# Patient Record
Sex: Female | Born: 1935 | Race: White | Hispanic: No | Smoking: Never smoker
Health system: Southern US, Community
[De-identification: ages and names within clinical notes are randomized; demographics above are authoritative.]

---

## 2016-01-17 ENCOUNTER — Encounter (HOSPITAL_COMMUNITY): Payer: Self-pay | Admitting: Emergency Medicine

## 2016-01-17 ENCOUNTER — Emergency Department (HOSPITAL_COMMUNITY): Payer: Medicare Other

## 2016-01-17 ENCOUNTER — Observation Stay (HOSPITAL_COMMUNITY)
Admission: EM | Admit: 2016-01-17 | Discharge: 2016-01-18 | Disposition: A | Discharge status: Home / Self Care | Payer: Medicare Other | Attending: Family Medicine | Admitting: Family Medicine

## 2016-01-17 DIAGNOSIS — E876 Hypokalemia: Secondary | ICD-10-CM | POA: Insufficient documentation

## 2016-01-17 DIAGNOSIS — Z87891 Personal history of nicotine dependence: Secondary | ICD-10-CM | POA: Insufficient documentation

## 2016-01-17 DIAGNOSIS — H5509 Other forms of nystagmus: Secondary | ICD-10-CM | POA: Diagnosis not present

## 2016-01-17 DIAGNOSIS — R42 Dizziness and giddiness: Secondary | ICD-10-CM

## 2016-01-17 DIAGNOSIS — E860 Dehydration: Secondary | ICD-10-CM | POA: Diagnosis not present

## 2016-01-17 DIAGNOSIS — R270 Ataxia, unspecified: Secondary | ICD-10-CM | POA: Insufficient documentation

## 2016-01-17 DIAGNOSIS — R030 Elevated blood-pressure reading, without diagnosis of hypertension: Secondary | ICD-10-CM | POA: Diagnosis not present

## 2016-01-17 DIAGNOSIS — H8110 Benign paroxysmal vertigo, unspecified ear: Principal | ICD-10-CM | POA: Insufficient documentation

## 2016-01-17 DIAGNOSIS — R739 Hyperglycemia, unspecified: Secondary | ICD-10-CM | POA: Insufficient documentation

## 2016-01-17 DIAGNOSIS — IMO0001 Reserved for inherently not codable concepts without codable children: Secondary | ICD-10-CM

## 2016-01-17 LAB — COMPREHENSIVE METABOLIC PANEL
ALT: 16 U/L (ref 14–54)
ANION GAP: 6 (ref 5–15)
AST: 20 U/L (ref 15–41)
Albumin: 3.8 g/dL (ref 3.5–5.0)
Alkaline Phosphatase: 33 U/L — ABNORMAL LOW (ref 38–126)
BUN: 18 mg/dL (ref 6–20)
CHLORIDE: 102 mmol/L (ref 101–111)
CO2: 31 mmol/L (ref 22–32)
CREATININE: 0.88 mg/dL (ref 0.44–1.00)
Calcium: 9.1 mg/dL (ref 8.9–10.3)
GFR, EST NON AFRICAN AMERICAN: 60 mL/min — AB (ref >60)
Glucose, Bld: 150 mg/dL — ABNORMAL HIGH (ref 65–99)
POTASSIUM: 3.7 mmol/L (ref 3.5–5.1)
Sodium: 139 mmol/L (ref 135–145)
Total Bilirubin: 0.8 mg/dL (ref 0.3–1.2)
Total Protein: 6.2 g/dL — ABNORMAL LOW (ref 6.5–8.1)

## 2016-01-17 LAB — URINALYSIS, ROUTINE W REFLEX MICROSCOPIC
Bilirubin Urine: NEGATIVE
GLUCOSE, UA: NEGATIVE mg/dL
Ketones, ur: NEGATIVE mg/dL
LEUKOCYTES UA: NEGATIVE
Nitrite: NEGATIVE
PROTEIN: NEGATIVE mg/dL
Specific Gravity, Urine: 1.014 (ref 1.005–1.030)
pH: 8 (ref 5.0–8.0)

## 2016-01-17 LAB — CBC
HCT: 44.7 % (ref 36.0–46.0)
Hemoglobin: 14.1 g/dL (ref 12.0–15.0)
MCH: 29.7 pg (ref 26.0–34.0)
MCHC: 31.5 g/dL (ref 30.0–36.0)
MCV: 94.1 fL (ref 78.0–100.0)
PLATELETS: 154 10*3/uL (ref 150–400)
RBC: 4.75 MIL/uL (ref 3.87–5.11)
RDW: 13.3 % (ref 11.5–15.5)
WBC: 7.1 10*3/uL (ref 4.0–10.5)

## 2016-01-17 LAB — URINE MICROSCOPIC-ADD ON

## 2016-01-17 LAB — LIPASE, BLOOD: LIPASE: 32 U/L (ref 11–51)

## 2016-01-17 LAB — I-STAT TROPONIN, ED
TROPONIN I, POC: 0.01 ng/mL (ref 0.00–0.08)
Troponin i, poc: 0.01 ng/mL (ref 0.00–0.08)

## 2016-01-17 MED ORDER — ONDANSETRON 4 MG PO TBDP
4.0000 mg | ORAL_TABLET | Freq: Once | ORAL | Status: AC
  Administered 2016-01-17: 4 mg via ORAL
  Filled 2016-01-17: qty 1

## 2016-01-17 MED ORDER — SODIUM CHLORIDE 0.9 % IV SOLN
Freq: Once | INTRAVENOUS | Status: AC
  Administered 2016-01-17: 15:00:00 via INTRAVENOUS

## 2016-01-17 MED ORDER — MECLIZINE HCL 25 MG PO TABS
12.5000 mg | ORAL_TABLET | Freq: Once | ORAL | Status: AC
  Administered 2016-01-17: 12.5 mg via ORAL
  Filled 2016-01-17: qty 1

## 2016-01-17 MED ORDER — DIAZEPAM 5 MG/ML IJ SOLN
2.5000 mg | Freq: Once | INTRAMUSCULAR | Status: AC
  Administered 2016-01-17: 2.5 mg via INTRAVENOUS
  Filled 2016-01-17: qty 2

## 2016-01-17 MED ORDER — SODIUM CHLORIDE 0.9 % IV BOLUS (SEPSIS)
1000.0000 mL | Freq: Once | INTRAVENOUS | Status: AC
  Administered 2016-01-17: 1000 mL via INTRAVENOUS

## 2016-01-17 MED ORDER — ONDANSETRON HCL 4 MG/2ML IJ SOLN
4.0000 mg | Freq: Once | INTRAMUSCULAR | Status: AC
  Administered 2016-01-17: 4 mg via INTRAVENOUS
  Filled 2016-01-17: qty 2

## 2016-01-17 NOTE — Progress Notes (Signed)
Received report from ED RN, Kim. 

## 2016-01-17 NOTE — ED Triage Notes (Signed)
Came from [place] yesterday driving , this am got dizzy and vomited x 1

## 2016-01-17 NOTE — ED Notes (Signed)
[phone] Lorin PicketScott- son staying at hotel call for updates.

## 2016-01-17 NOTE — ED Notes (Addendum)
Pt ambulated well. Pt reports feeling better but reports still feels off balanced.

## 2016-01-17 NOTE — ED Notes (Signed)
Ambulated pt to restroom. Pt stood self efficiently with no difficulty. Pt reached for handrails in hallway when ambulating to restroom but showed no signs of being at fall risk.

## 2016-01-17 NOTE — Progress Notes (Signed)
Attempted to get report from ED.  ED RN will call 5W.

## 2016-01-17 NOTE — H&P (Signed)
[facility] History and Physical Service Pager: [phone]  Patient name: Alicia Patrick Medical record number: [id number] Date of birth: 1932/12/19 Age: 82 y.o. Gender: female  Primary Care Provider: Pcp Not In System Consultants: none Code Status: FULL  Chief Complaint: Dizzines  Assessment and Plan: Alicia Patrick is a 82 y.o. female presenting with dizziness and two episodes of vomiting. No significant PMH.   Dizziness, Vomiting Differential includes vertigo, vasovagal, orthostatic hypotension. Suspect BPPV worsened by dehydration as most likely etiology. Orthostatic vitals were normal. MRI negative for intracranial pathology. EKG, troponins negative. Horizontal nystagmus noted on physical exam. Will treat for vertigo and observe overnight. Has improved with fluids, zofran, and antivert. Received one dose of valium in ED due to anxiety. - Place in observation on Family Medicine Teaching Service, Dr. Lum BabeEniola attending - AM BMP and CBC - Antivert twice a day as needed - Zofran q8h for nausea - Clincially dehydrated on exam, continue NS125cc/hr x24hr - PT/OT consults placed. Vestibular rehab.  Hyperglycemia: Blood sugar elevated to 150 at admission. Denies prior history of diabetes. - Check A1C - Continue to monitor on daily BMP. Consider three times daily AC blood sugar checks if remains elevated.  FEN/GI: Heart Healthy Diet Prophylaxis: Lovenox  Disposition: Place in Observation  History of Present Illness:  Alicia Patrick is a 82 y.o. female presenting with dizziness that began this morning.  Patient was driving down with family yesterday from [place] to [place] where they stopped for the night [facility] the way [facility] grandson's graduation from [employer]. Patient sat in the backseat and read most of the car ride. Did not have much to eat or drink during car ride. Stayed at hotel locally. Ate at restaurant across from hotel last night, had  catfish, rice, salad. Felt fine overnight and woke up this morning to eat breakfast at the hotel. After breakfast she got up to go to the bathroom and had a normal bowel movement, but afterwards felt unable to walk without holding on to something. Per patient she felt "spacy" and unbalanced. Family decided to bring her to the hospital and on the way here in the car she vomited. Felt improved in ED with fluids and was getting ready to be discharged, but again after going to bathroom around 5 pm she threw up water she had drank during the day. Denies fevers, chills, chest pain, shortness of breath, dysuria, constipation, diarrhea. No leg pain or swelling. Does endorse minimal leg weakness bilaterally. Has had lower blood pressure when she was younger.   Review Of Systems: Per HPI Otherwise the remainder of the systems were negative.  Patient Active Problem List   Diagnosis Date Noted  . Vertigo 01/17/2016    Past Medical History: History reviewed. No pertinent past medical history.  Past Surgical History: History reviewed. No pertinent surgical history.  Blepharoplasty  Lumpectomy  Social History: Social History  Substance Use Topics  . Smoking status: Never Smoker  . Smokeless tobacco: Never Used  . Alcohol use Not on file   Additional social history: lives in [place], traveling for family event. Former smoker, quit over 40 years ago. Please also refer to relevant sections of EMR.  Family History: No family history on file. FH- mother died from lung cancer, father hx of MI. No diabetes or high blood pressure.   Allergies and Medications: Not on File No current facility-administered medications on file prior to encounter.    No current outpatient prescriptions on file  prior to encounter.    Objective: BP 150/69   Pulse 69   Temp 97.2 F (36.2 C) (Oral)   Resp 15   SpO2 (!) 86%  Exam: General: pleasant elderly lady, laying comfortably in bed in no acute distress Eyes:  nystagmus of left eye with looking to left, EOMI. Pupils equal round and reactive to light.  ENTM: dry mucous membranes, poor dentition, nares and oropharynx normal Cardiovascular: rrr no murmurs rubs or gallops Respiratory: cta bilaterally no increased work of breathing Abdomen: soft, non-tender, normal bowel sounds MSK: normal strength and tone Extremities: warm, well perfused, no cyanosis or edema  Skin: warm, dry, no rashes Neuro: CN2-12 in tact, normal strength and sensation throughout   Labs and Imaging: CBC BMET   Recent Labs Lab 01/17/16 1135  WBC 7.1  HGB 14.1  HCT 44.7  PLT 154    Recent Labs Lab 01/17/16 1135  NA 139  K 3.7  CL 102  CO2 31  BUN 18  CREATININE 0.88  GLUCOSE 150*  CALCIUM 9.1    - Troponin negative x2  MRI- IMPRESSION: No acute intracranial process on this motion degraded examination. EKG- nsr, some questionable LVH Troponins negative x 2  Tillman Sers, DO 01/17/2016, 11:45 PM PGY-1, [facility] FPTS Intern pager: [phone], text pages welcome  Upper Level Addendum:  I have seen and evaluated this patient along with Dr. Wonda Olds and reviewed the above note, making necessary revisions in blue.   Dr. Garry Heater, DO, PGY2 09/*10/2015; 12:48 AM

## 2016-01-17 NOTE — ED Notes (Signed)
Pt st;s feels some better,  Ice chips and crackers given to pt.

## 2016-01-17 NOTE — ED Notes (Signed)
Family stepped out to take a break

## 2016-01-17 NOTE — ED Notes (Signed)
Pt starting to be discharged, pt drank a lot of water then became nauseated and vomited clear liquid..  Pt remains alert and oriented x's 3.  Family at bedside.

## 2016-01-17 NOTE — ED Provider Notes (Addendum)
[facility]-EMERGENCY DEPT Provider Note   CSN: [id number] Arrival date & time: 01/17/16  1102     History   Chief Complaint Chief Complaint  Patient presents with  . Dizziness  . Emesis    HPI Alicia Patrick is a 82 y.o. female.  HPI   Alicia Patrick is a 82 y.o. female, Patient with no pertinent past medical history, presenting to the ED with lightheadedness, nausea, and one episode of vomiting that came on suddenly this morning just prior to arrival. Pt states she was sitting in the hotel lobby, eating breakfast, when she had a sudden onset of lightheadedness, nausea, and diaphoresis. Patient was then unsteady on her feet as she walked to the bathroom. She had to be assisted by her daughter. The episode lasted for no more than 15 minutes. Patient is feeling normal upon presentation to the ED. Patient adds that she is visiting from [place] on the way through to [place]. Patient was in the car all day yesterday. She states that she had a checkup with her PCP about a month ago with no abnormalities found.  Denies fever/chills, shortness of breath, chest pain, recent illness, or any other complaints.  Patient is accompanied by her sons and daughter at the bedside.  History reviewed. No pertinent past medical history.  There are no active problems to display for this patient.   History reviewed. No pertinent surgical history.  OB History    No data available       Home Medications    Prior to Admission medications   Medication Sig Start Date End Date Taking? Authorizing Provider  Multiple Vitamin (MULTIVITAMIN) tablet Take 1 tablet by mouth daily.   Yes Historical Provider, MD    Family History No family history on file.  Social History Social History  Substance Use Topics  . Smoking status: Never Smoker  . Smokeless tobacco: Never Used  . Alcohol use Not on file     Allergies   Review of patient's allergies indicates not on file.   Review of Systems Review  of Systems  Constitutional: Positive for diaphoresis (resolved). Negative for chills and fever.  Respiratory: Negative for shortness of breath.   Cardiovascular: Negative for chest pain.  Gastrointestinal: Positive for nausea (resolved) and vomiting (resolved).  Skin: Negative for color change and pallor.  Neurological: Positive for light-headedness (resolved). Negative for syncope, speech difficulty, weakness, numbness and headaches.  All other systems reviewed and are negative.    Physical Exam Updated Vital Signs BP 182/96 (BP Location: Right Arm)   Pulse 72   Temp 97.2 F (36.2 C) (Oral)   SpO2 97%   Physical Exam  Constitutional: She is oriented to person, place, and time. She appears well-developed and well-nourished. No distress.  HENT:  Head: Normocephalic and atraumatic.  Eyes: Conjunctivae and EOM are normal. Pupils are equal, round, and reactive to light.  Neck: Normal range of motion. Neck supple.  Cardiovascular: Normal rate, regular rhythm, normal heart sounds and intact distal pulses.   Pulmonary/Chest: Effort normal and breath sounds normal. No respiratory distress.  Abdominal: Soft. There is no tenderness. There is no guarding.  Musculoskeletal: She exhibits no edema or tenderness.  Full ROM in all extremities and spine. No midline spinal tenderness.   Lymphadenopathy:    She has no cervical adenopathy.  Neurological: She is alert and oriented to person, place, and time. She has normal reflexes.  No sensory deficits. Strength 5/5 in all extremities. No gait disturbance. Coordination intact.  Cranial nerves III-XII grossly intact. No facial droop.   Skin: Skin is warm and dry. She is not diaphoretic. No pallor.  Psychiatric: She has a normal mood and affect. Her behavior is normal.  Nursing note and vitals reviewed.    ED Treatments / Results  Labs (all labs ordered are listed, but only abnormal results are displayed) Labs Reviewed  COMPREHENSIVE METABOLIC  PANEL - Abnormal; Notable for the following:       Result Value   Glucose, Bld 150 (*)    Total Protein 6.2 (*)    Alkaline Phosphatase 33 (*)    GFR calc non Af Amer 60 (*)    All other components within normal limits  URINALYSIS, ROUTINE W REFLEX MICROSCOPIC (NOT AT [facility]) - Abnormal; Notable for the following:    APPearance CLOUDY (*)    Hgb urine dipstick TRACE (*)    All other components within normal limits  URINE MICROSCOPIC-ADD ON - Abnormal; Notable for the following:    Squamous Epithelial / LPF 0-5 (*)    Bacteria, UA RARE (*)    Casts GRANULAR CAST (*)    All other components within normal limits  LIPASE, BLOOD  CBC  I-STAT TROPOININ, ED    EKG  EKG Interpretation  Date/Time:  Tuesday January 17 2016 11:32:47 EDT Ventricular Rate:  71 PR Interval:  164 QRS Duration: 84 QT Interval:  396 QTC Calculation: 430 R Axis:   -23 Text Interpretation:  Normal sinus rhythm Moderate voltage criteria for LVH, may be normal variant Borderline ECG No old tracing to compare Confirmed by FLOYD MD, DANIEL ([id number]) on 01/17/2016 11:47:47 AM       Radiology No results found.  Procedures Procedures (including critical care time)  Medications Ordered in ED Medications  sodium chloride 0.9 % bolus 1,000 mL (0 mLs Intravenous Stopped 01/17/16 1346)  0.9 %  sodium chloride infusion ( Intravenous New Bag/Given 01/17/16 1451)     Initial Impression / Assessment and Plan / ED Course  I have reviewed the triage vital signs and the nursing notes.  Pertinent labs & imaging results that were available during my care of the patient were reviewed by me and considered in my medical decision making (see chart for details).  Clinical Course    Alicia Patrick presents with an episode of lightheadedness, nausea and vomiting.  Findings and plan of care discussed with Melene Plan, DO. Dr. Adela Lank personally evaluated and examined this patient.  Suspect an episode of vasovagal near-syncope. This is  likely brought on by dehydration and fatigue from traveling. Patient endorses drinking only one bottle of water during the 14 hour trip yesterday. Patient ambulated without assistance or difficulty. Patient continued to feel normal throughout her time in the ED. No significant abnormalities on the patient's labs. Patient's hypertension is noted and may be due to recent increased salt intake and decreased hydration, which patient admits to. Patient to follow up with her PCP upon her return home.   When patient was about to be discharged, she began to feel nauseous again and had an episode of emesis. Nausea improved with Zofran. Patient then became unsteady on her feet and showed symptoms of ataxia. She had to be completely assisted from the bathroom. This episode of ataxia may be due to a vertigo, however, more central issues need to be ruled out. We will obtain a MRI and continue IV fluids. This new information was discussed with Dr. Clydene Pugh after EDP shift change.  8:29 PM End  of shift patient care handoff report given to Danelle BerryLeisa Tapia, PA-C. Plan: Review MRI. If normal, walk the patient and assure stability. Also assure patient can tolerate PO. If patient does well, discharge with meclizine and Zofran. PCP follow up.   Vitals:   01/17/16 1358 01/17/16 1400 01/17/16 1430 01/17/16 1500  BP: 163/86 164/85 167/94 169/86  Pulse: 74 75 74 68  Resp: 24 19 18 18   Temp:      TempSrc:      SpO2: 96% 95% 99% 97%   Vitals:   01/17/16 1400 01/17/16 1430 01/17/16 1500 01/17/16 1530  BP: 164/85 167/94 169/86 163/83  Pulse: 75 74 68 68  Resp: 19 18 18 17   Temp:      TempSrc:      SpO2: 95% 99% 97% 98%      Final Clinical Impressions(s) / ED Diagnoses   Final diagnoses:  Lightheadedness  Dehydration    New Prescriptions New Prescriptions   No medications on file     Anselm PancoastShawn C Corinthian Kemler, PA-C 01/17/16 1558    Melene Planan Floyd, DO 01/17/16 1603    Arlynn Mcdermid C Jhordan Kinter, PA-C 01/17/16 2031    Melene Planan Floyd,  DO 01/18/16 [id number]    Melene Planan Floyd, DO 01/18/16 1001

## 2016-01-17 NOTE — ED Provider Notes (Signed)
82 y/o female with lightheadedness, N, V, loss of equilibrium.  Has ataxia and intermittent episodes of this, lasting 15 minutes at a time. Pt was given to me at shift change with MRI of brain pending.    Results for orders placed or performed during the hospital encounter of 01/17/16  Lipase, blood  Result Value Ref Range   Lipase 32 11 - 51 U/L  Comprehensive metabolic panel  Result Value Ref Range   Sodium 139 135 - 145 mmol/L   Potassium 3.7 3.5 - 5.1 mmol/L   Chloride 102 101 - 111 mmol/L   CO2 31 22 - 32 mmol/L   Glucose, Bld 150 (H) 65 - 99 mg/dL   BUN 18 6 - 20 mg/dL   Creatinine, Ser 1.910.88 0.44 - 1.00 mg/dL   Calcium 9.1 8.9 - 47.810.3 mg/dL   Total Protein 6.2 (L) 6.5 - 8.1 g/dL   Albumin 3.8 3.5 - 5.0 g/dL   AST 20 15 - 41 U/L   ALT 16 14 - 54 U/L   Alkaline Phosphatase 33 (L) 38 - 126 U/L   Total Bilirubin 0.8 0.3 - 1.2 mg/dL   GFR calc non Af Amer 60 (L) >60 mL/min   GFR calc Af Amer >60 >60 mL/min   Anion gap 6 5 - 15  CBC  Result Value Ref Range   WBC 7.1 4.0 - 10.5 K/uL   RBC 4.75 3.87 - 5.11 MIL/uL   Hemoglobin 14.1 12.0 - 15.0 g/dL   HCT 29.544.7 62.136.0 - 30.846.0 %   MCV 94.1 78.0 - 100.0 fL   MCH 29.7 26.0 - 34.0 pg   MCHC 31.5 30.0 - 36.0 g/dL   RDW 65.713.3 84.611.5 - 96.215.5 %   Platelets 154 150 - 400 K/uL  Urinalysis, Routine w reflex microscopic  Result Value Ref Range   Color, Urine YELLOW YELLOW   APPearance CLOUDY (A) CLEAR   Specific Gravity, Urine 1.014 1.005 - 1.030   pH 8.0 5.0 - 8.0   Glucose, UA NEGATIVE NEGATIVE mg/dL   Hgb urine dipstick TRACE (A) NEGATIVE   Bilirubin Urine NEGATIVE NEGATIVE   Ketones, ur NEGATIVE NEGATIVE mg/dL   Protein, ur NEGATIVE NEGATIVE mg/dL   Nitrite NEGATIVE NEGATIVE   Leukocytes, UA NEGATIVE NEGATIVE  Urine microscopic-add on  Result Value Ref Range   Squamous Epithelial / LPF 0-5 (A) NONE SEEN   WBC, UA 0-5 0 - 5 WBC/hpf   RBC / HPF 0-5 0 - 5 RBC/hpf   Bacteria, UA RARE (A) NONE SEEN   Casts GRANULAR CAST (A) NEGATIVE   Urine-Other MUCOUS PRESENT   I-stat troponin, ED  Result Value Ref Range   Troponin i, poc 0.01 0.00 - 0.08 ng/mL   Comment 3          I-stat troponin, ED  Result Value Ref Range   Troponin i, poc 0.01 0.00 - 0.08 ng/mL   Comment 3           Mr Brain Wo Contrast  Result Date: 01/17/2016 CLINICAL DATA:  Lightheadedness, nausea and vomiting today in hotel lobby. EXAM: MRI HEAD WITHOUT CONTRAST TECHNIQUE: Multiplanar, multiecho pulse sequences of the brain and surrounding structures were obtained without intravenous contrast. COMPARISON:  None. FINDINGS: Multiple sequences are moderately motion degraded. INTRACRANIAL CONTENTS: No reduced diffusion to suggest acute ischemia. No susceptibility artifact to suggest hemorrhage. The ventricles and sulci are normal for patient's age. Patchy to confluent supratentorial white matter FLAIR T2 hyperintensities. No suspicious parenchymal signal, masses  or mass effect. No abnormal extra-axial fluid collections. No extra-axial masses though, contrast enhanced sequences would be more sensitive. Normal major intracranial vascular flow voids present at skull base. ORBITS: The included ocular globes and orbital contents are non-suspicious. Status post bilateral ocular lens implants. SINUSES: The mastoid air-cells and included paranasal sinuses are well-aerated. SKULL/SOFT TISSUES: No abnormal sellar expansion. No suspicious calvarial bone marrow signal. Craniocervical junction maintained. IMPRESSION: No acute intracranial process on this motion degraded examination. Involutional changes. Moderate to severe chronic small vessel ischemic disease. Electronically Signed   By: Awilda Metro M.D.   On: 01/17/2016 20:52   Pt unable to ambulate without assistance and holding onto objects.  Likely BPPV or peripheral vertigo, intractable.  She is not orthostatic, and otherwise is hemodynamically stable, negative cardiac enzymes x 2, no UTI, tolerating chips and sips. She is very  anxious.  Dr. Clydene Pugh has seen and evaluated the pt, earlier worked up initially by Myer Peer and attending EDP Dr. Adela Lank, Dr. Clydene Pugh agrees with admission for sx vertigo  Discussed with Dr. Wonda [facility] to see Med-surg obs, Dr. Deirdre Priest attending   Danelle Berry, PA-C 01/17/16 2221    Lyndal Pulley, MD 01/18/16 [phone]

## 2016-01-17 NOTE — ED Notes (Signed)
Pt ambulatory to bathroom with asst.

## 2016-01-18 DIAGNOSIS — R739 Hyperglycemia, unspecified: Secondary | ICD-10-CM | POA: Diagnosis not present

## 2016-01-18 DIAGNOSIS — R42 Dizziness and giddiness: Secondary | ICD-10-CM

## 2016-01-18 DIAGNOSIS — H8110 Benign paroxysmal vertigo, unspecified ear: Secondary | ICD-10-CM | POA: Diagnosis not present

## 2016-01-18 DIAGNOSIS — R030 Elevated blood-pressure reading, without diagnosis of hypertension: Secondary | ICD-10-CM | POA: Diagnosis not present

## 2016-01-18 DIAGNOSIS — IMO0001 Reserved for inherently not codable concepts without codable children: Secondary | ICD-10-CM

## 2016-01-18 LAB — BASIC METABOLIC PANEL
ANION GAP: 11 (ref 5–15)
BUN: 13 mg/dL (ref 6–20)
CHLORIDE: 98 mmol/L — AB (ref 101–111)
CO2: 28 mmol/L (ref 22–32)
CREATININE: 0.65 mg/dL (ref 0.44–1.00)
Calcium: 7.6 mg/dL — ABNORMAL LOW (ref 8.9–10.3)
GFR calc non Af Amer: >60 mL/min (ref >60)
Glucose, Bld: 116 mg/dL — ABNORMAL HIGH (ref 65–99)
POTASSIUM: 3.3 mmol/L — AB (ref 3.5–5.1)
Sodium: 137 mmol/L (ref 135–145)

## 2016-01-18 LAB — CBC
HEMATOCRIT: 41.2 % (ref 36.0–46.0)
HEMOGLOBIN: 13.2 g/dL (ref 12.0–15.0)
MCH: 29.9 pg (ref 26.0–34.0)
MCHC: 32 g/dL (ref 30.0–36.0)
MCV: 93.2 fL (ref 78.0–100.0)
Platelets: 145 10*3/uL — ABNORMAL LOW (ref 150–400)
RBC: 4.42 MIL/uL (ref 3.87–5.11)
RDW: 13.2 % (ref 11.5–15.5)
WBC: 9.3 10*3/uL (ref 4.0–10.5)

## 2016-01-18 MED ORDER — ONDANSETRON 4 MG PO TBDP: 4.0000 mg | ORAL_TABLET | Freq: Three times a day (TID) | ORAL | 0 refills | Status: DC | PRN

## 2016-01-18 MED ORDER — MECLIZINE HCL 12.5 MG PO TABS: 12.5000 mg | ORAL_TABLET | Freq: Two times a day (BID) | ORAL | 0 refills | Status: AC | PRN

## 2016-01-18 MED ORDER — ENOXAPARIN SODIUM 40 MG/0.4ML ~~LOC~~ SOLN
40.0000 mg | Freq: Every day | SUBCUTANEOUS | Status: DC
  Administered 2016-01-18: 40 mg via SUBCUTANEOUS
  Filled 2016-01-18: qty 0.4

## 2016-01-18 MED ORDER — SODIUM CHLORIDE 0.9 % IV SOLN
INTRAVENOUS | Status: DC
  Administered 2016-01-18: 01:00:00 via INTRAVENOUS

## 2016-01-18 MED ORDER — MECLIZINE HCL 12.5 MG PO TABS: 12.5000 mg | ORAL_TABLET | Freq: Two times a day (BID) | ORAL | Status: DC | PRN

## 2016-01-18 MED ORDER — MECLIZINE HCL 12.5 MG PO TABS: 12.5000 mg | ORAL_TABLET | Freq: Two times a day (BID) | ORAL | 0 refills | Status: DC | PRN

## 2016-01-18 MED ORDER — ONDANSETRON 4 MG PO TBDP: 4.0000 mg | ORAL_TABLET | Freq: Three times a day (TID) | ORAL | Status: DC | PRN

## 2016-01-18 MED ORDER — ONDANSETRON 4 MG PO TBDP: 4.0000 mg | ORAL_TABLET | Freq: Three times a day (TID) | ORAL | 0 refills | Status: AC | PRN

## 2016-01-18 NOTE — Care Management Note (Signed)
Case Management Note  Patient Details  Name: Kem KaysSharon Jeffreys MRN: [id number] Date of Birth: [birth year]  Subjective/Objective:                 Patient traveling from [place], [place]. Lives there in a second floor condo with her daughter and son in law, independent does not use DME for ambulation. Patient has lift for stairs to use if needed. PCP is Dr Boris SharperAlfred Belding. Patient states that she knows Outpatient PT she would like to use when she returns home to [place], if needed. In obs for vertigo.    Action/Plan:  No CM needs identified at this time, PT eval pending Expected Discharge Date:                  Expected Discharge Plan:  Home/Self Care  In-House Referral:  NA  Discharge planning Services  CM Consult  Post Acute Care Choice:  NA Choice offered to:  NA  DME Arranged:  N/A DME Agency:  NA  HH Arranged:  NA HH Agency:  NA  Status of Service:  Completed, signed off  If discussed at Long Length of Stay Meetings, dates discussed:    Additional Comments:  Lawerance SabalDebbie Rushawn Capshaw, RN 01/18/2016, 11:39 AM

## 2016-01-18 NOTE — Progress Notes (Signed)
NURSING PROGRESS NOTE  Alicia Patrick Jagiello [id number] Admission Data: 01/18/2016 2:01 AM Attending Provider: Carney LivingMarshall L Chambliss, MD PCP:Pcp Not In System Code Status: FULL  Allergies:  Review of patient's allergies indicates not on file. Past Medical History:   has no past medical history on file. Past Surgical History:   has no past surgical history on file. Social History:   reports that she has never smoked. She has never used smokeless tobacco.  Alicia Patrick Piercey is a 82 y.o. female patient admitted from ED:   Last Documented Vital Signs: Blood pressure (!) 147/69, pulse 74, temperature 98.3 F (36.8 C), temperature source Oral, resp. rate 16, height 5\' 6"  (1.676 m), weight 67.1 kg (147 lb 14.9 oz), SpO2 96 %.   IV Fluids:  IV in place, occlusive dsg intact without redness, IV cath antecubital right, condition patent and no redness normal saline.   Skin: Appropriate for ethnicity and intact with bruising to left lower leg.  Patient orientated to room. Information packet given to patient. Admission inpatient armband information verified with patient to include name and date of birth and placed on patient arm. Side rails up x 2, fall assessment and education completed with patient. Patient able to verbalize understanding of risk associated with falls and verbalized understanding to call for assistance before getting out of bed. Call light within reach. Patient able to voice and demonstrate understanding of unit orientation instructions.    Will continue to evaluate and treat per MD orders.  Sue LushKaelin Romesberg RN, BSN

## 2016-01-18 NOTE — Discharge Instructions (Signed)
You have been seen today for an episode of lightheadedness and vomiting. Your lab tests showed no acute or significant abnormalities. Your blood pressure was also found to be higher than normal, but not a dangerous level. Follow-up with your PCP once you get back home to have this reevaluated. Return to ED should symptoms recur.  Be sure to drink plenty of fluids to stay well-hydrated. This includes about eight 8oz glasses of water a day, especially in hot weather and when traveling.  Please follow up with your PCP about possibly checking for diabetes as some of your blood sugars were elevated during your hospital stay.

## 2016-01-18 NOTE — Progress Notes (Signed)
Family Medicine Teaching Service Daily Progress Note Intern Pager: [phone]  Patient name: Alicia Patrick Medical record number: [id number] Date of birth: [birth year] Age: 82 y.o. Gender: female  Primary Care Provider: Pcp Not In System Consultants: none Code Status: FULL  Pt Overview and Major Events to Date:  9/5 Admit to FPTS  Assessment and Plan: Alicia Patrick is a 82 y.o. female presenting with dizziness and two episodes of vomiting. No significant PMH.   Dizziness, Vomiting Differential includes vertigo, vasovagal, orthostatic hypotension. Suspect BPPV worsened by dehydration as most likely etiology. Orthostatic vitals were normal. MRI negative for intracranial pathology. EKG, troponins negative. Horizontal nystagmus continued on physical exam. Most consistent with vertigo. Has improved with fluids, zofran, and antivert. - Place in observation on Family Medicine Teaching Service, Dr. Lum Babe attending - AM BMP and CBC wnl - Antivert twice a day as needed - Zofran q8h for nausea - NS125cc/hr x24hr - PT/OT consults placed. Vestibular rehab. Awaiting their recs  Hyperglycemia: Blood sugar elevated to 150 at admission. Denies prior history of diabetes. - A1C pending - fasting sugar this am 116 - Continue to monitor on daily BMP. Consider three times daily AC blood sugar checks if remains elevated  FEN/GI: Heart Healthy Diet Prophylaxis: Lovenox  Disposition: discharge home pending clinical improvement, PT/OT evaluation  Subjective:  Alicia Patrick is feeling well this morning, was able to sleep a little bit. Also was able to ambulate to bathroom with help of nurse and had minimal dizziness, much less severe than yesterdays episodes. Feels improved. Denies weakness, nausea, vomiting.   Objective: Temp:  [97.2 F (36.2 C)-98.6 F (37 C)] 98.6 F (37 C) (09/06 0553) Pulse Rate:  [67-78] 72 (09/06 0553) Resp:  [11-24] 17 (09/06 0553) BP: (108-186)/(48-97) 108/48 (09/06 0553) SpO2:   [86 %-99 %] 96 % (09/06 0553) Weight:  [147 lb 14.9 oz (67.1 kg)] 147 lb 14.9 oz (67.1 kg) (09/06 0033) Physical Exam: General: pleasant elderly lady, laying comfortably in bed in no acute distress Eyes: nystagmus of left eye with looking to left, EOMI.  ENTM: dry mucous membranes, poor dentition, nares and oropharynx normal Cardiovascular: rrr no murmurs rubs or gallops Respiratory: cta bilaterally no increased work of breathing Abdomen: soft, non-tender, normal bowel sounds MSK: normal strength and tone in upper and lower extremities bilaterally Extremities: warm, well perfused, no cyanosis or edema  Skin: warm, dry, no rashes Neuro: CN2-12 in tact, normal strength and sensation throughout   Laboratory:  Recent Labs Lab 01/17/16 1135 01/18/16 0239  WBC 7.1 9.3  HGB 14.1 13.2  HCT 44.7 41.2  PLT 154 145*    Recent Labs Lab 01/17/16 1135 01/18/16 0239  NA 139 137  K 3.7 3.3*  CL 102 98*  CO2 31 28  BUN 18 13  CREATININE 0.88 0.65  CALCIUM 9.1 7.6*  PROT 6.2*  --   BILITOT 0.8  --   ALKPHOS 33*  --   ALT 16  --   AST 20  --   GLUCOSE 150* 116*    Trops 0.01 x 2 UA negative for nitrite, leuk esterase  Imaging/Diagnostic Tests:  Mr Brain 74 Contrast  Result Date: 01/17/2016 CLINICAL DATA:  Lightheadedness, nausea and vomiting today in hotel lobby. EXAM: MRI HEAD WITHOUT CONTRAST TECHNIQUE: Multiplanar, multiecho pulse sequences of the brain and surrounding structures were obtained without intravenous contrast. COMPARISON:  None. FINDINGS: Multiple sequences are moderately motion degraded. INTRACRANIAL CONTENTS: No reduced diffusion to suggest acute ischemia. No susceptibility artifact to suggest hemorrhage.  The ventricles and sulci are normal for patient's age. Patchy to confluent supratentorial white matter FLAIR T2 hyperintensities. No suspicious parenchymal signal, masses or mass effect. No abnormal extra-axial fluid collections. No extra-axial masses though,  contrast enhanced sequences would be more sensitive. Normal major intracranial vascular flow voids present at skull base. ORBITS: The included ocular globes and orbital contents are non-suspicious. Status post bilateral ocular lens implants. SINUSES: The mastoid air-cells and included paranasal sinuses are well-aerated. SKULL/SOFT TISSUES: No abnormal sellar expansion. No suspicious calvarial bone marrow signal. Craniocervical junction maintained. IMPRESSION: No acute intracranial process on this motion degraded examination. Involutional changes. Moderate to severe chronic small vessel ischemic disease. Electronically Signed   By: Awilda Metroourtnay  Bloomer M.D.   On: 01/17/2016 20:52    Tillman Sersngela C Lowry Bala, DO 01/18/2016, 6:53 AM PGY-1, [place] FPTS Intern pager: [phone], text pages welcome

## 2016-01-18 NOTE — Progress Notes (Signed)
Occupational Therapy Evaluation Patient Details Name: Alicia Patrick MRN: [id number] DOB: 1932-07-20 Today's Date: 01/18/2016    History of Present Illness  82 y.o. female presenting with dizziness and two episodes of vomiting. No significant PMH.    Clinical Impression   Patient presents to OT at or close to baseline with ADLs. Min guard A for safety and lines getting around the room. No c/o dizziness on evaluation. No further OT needs; will sign off.    Follow Up Recommendations  No OT follow up    Equipment Recommendations  Tub/shower seat (pt to obtain when she gets home)    Recommendations for Other Services PT consult     Precautions / Restrictions Precautions Precautions: Fall      Mobility Bed Mobility Overal bed mobility: Independent                Transfers Overall transfer level: Needs assistance   Transfers: Sit to/from Stand Sit to Stand: Min guard         General transfer comment: tending to reach out to steady herself on bed/furniture. Denies need for cane/RW.    Balance                                            ADL Overall ADL's : Needs assistance/impaired Eating/Feeding: Independent;Sitting   Grooming: Set up;Sitting   Upper Body Bathing: Set up;Sitting   Lower Body Bathing: Supervison/ safety;Sit to/from stand   Upper Body Dressing : Set up;Sitting   Lower Body Dressing: Supervision/safety   Toilet Transfer: Min guard   Toileting- Clothing Manipulation and Hygiene: Min guard       Functional mobility during ADLs: Min guard General ADL Comments: Patient had no reports of dizziness during session. Got up, ambulated around bed to recliner to eat breakfast. Reports she has been toileting with nursing supervision. Able to reach all body parts. Plan is for family to pick her up and take her back to [place].     Vision     Perception     Praxis      Pertinent Vitals/Pain Pain Assessment: No/denies pain      Hand Dominance Left   Extremity/Trunk Assessment Upper Extremity Assessment Upper Extremity Assessment: Overall WFL for tasks assessed   Lower Extremity Assessment Lower Extremity Assessment: Defer to PT evaluation       Communication Communication Communication: No difficulties   Cognition Arousal/Alertness: Awake/alert Behavior During Therapy: WFL for tasks assessed/performed Overall Cognitive Status: Within Functional Limits for tasks assessed                     General Comments       Exercises       Shoulder Instructions      Home Living Family/patient expects to be discharged to:: Private residence Living Arrangements: Children;Other relatives Available Help at Discharge: Family                             Additional Comments: pt driving through here from [place] to [facility] for a graduation      Prior Functioning/Environment Level of Independence: Independent             OT Diagnosis: Generalized weakness   OT Problem List: Decreased activity tolerance;Impaired balance (sitting and/or standing);Decreased knowledge of use of DME or AE  OT Treatment/Interventions:      OT Goals(Current goals can be found in the care plan section) Acute Rehab OT Goals Patient Stated Goal: to be able to keep food down OT Goal Formulation: All assessment and education complete, DC therapy  OT Frequency:     Barriers to D/C:            Co-evaluation              End of Session Nurse Communication: Mobility status  Activity Tolerance: Patient tolerated treatment well Patient left: in chair;with call bell/phone within reach;with chair alarm set   Time: 0946-1001 OT Time Calculation (min): 15 min Charges:  OT General Charges $OT Visit: 1 Procedure OT Evaluation $OT Eval Low Complexity: 1 Procedure G-Codes: OT G-codes **NOT FOR INPATIENT CLASS** Functional Assessment Tool Used: clinical judgment Functional Limitation: Self care Self Care  Current Status (Z6109(G8987): At least 1 percent but less than 20 percent impaired, limited or restricted Self Care Goal Status (U0454(G8988): At least 1 percent but less than 20 percent impaired, limited or restricted Self Care Discharge Status [phone]): At least 1 percent but less than 20 percent impaired, limited or restricted  Amisadai Woodford A 01/18/2016, 10:18 AM

## 2016-01-18 NOTE — Care Management Obs Status (Signed)
MEDICARE OBSERVATION STATUS NOTIFICATION   Patient Details  Name: Alicia Patrick MRN: [id number] Date of Birth: 1932-09-18   Medicare Observation Status Notification Given:  Yes    Lawerance Sabalebbie Oluwakemi Salsberry, RN 01/18/2016, 11:38 AM

## 2016-01-18 NOTE — Evaluation (Signed)
Physical Therapy Evaluation Patient Details Name: Alicia KaysSharon Patrick MRN: [id number] DOB: 01-05-1933 Today's Date: 01/18/2016   History of Present Illness   82 y.o. female presenting with dizziness and two episodes of vomiting. No significant PMH.   Clinical Impression  Pt admitted with/for vertigo.  Pt currently limited functionally due to the problems listed below.  (see problems list.)  Pt will benefit from PT to maximize function and safety to be able to get home safely with available assist of family.     Follow Up Recommendations No PT follow up    Equipment Recommendations  None recommended by PT    Recommendations for Other Services       Precautions / Restrictions Precautions Precautions: Fall      Mobility  Bed Mobility Overal bed mobility: Independent                Transfers Overall transfer level: Needs assistance   Transfers: Sit to/from Stand Sit to Stand: Supervision         General transfer comment: tending to reach out to steady herself on bed/furniture. Denies need for cane/RW.  Ambulation/Gait Ambulation/Gait assistance: Min assist Ambulation Distance (Feet): 200 Feet Assistive device:  (iv or HHA of 1) Gait Pattern/deviations: Step-through pattern Gait velocity: slower Gait velocity interpretation: Below normal speed for age/gender General Gait Details: mildly unsteady overall with some drifting and frequently feeling the need to reach for something stable.  generally tentative.  Stairs            Wheelchair Mobility    Modified Rankin (Stroke Patients Only)       Balance Overall balance assessment: Needs assistance   Sitting balance-Leahy Scale: Good       Standing balance-Leahy Scale: Fair Standing balance comment: feels disequilibrium                             Pertinent Vitals/Pain Pain Assessment: No/denies pain    Home Living Family/patient expects to be discharged to:: Private residence Living  Arrangements: Children;Other relatives Available Help at Discharge: Family Type of Home: House Home Access: Stairs to enter   [employer] Corporation[employer]trance Stairs-Number of Steps: 10 with stair lift access Home Layout: Two level   Additional Comments: [place] [facility]    Prior Function Level of Independence: Independent               Hand Dominance   Dominant Hand: Left    Extremity/Trunk Assessment               Lower Extremity Assessment: Overall WFL for tasks assessed         Communication   Communication: No difficulties  Cognition Arousal/Alertness: Awake/alert Behavior During Therapy: WFL for tasks assessed/performed Overall Cognitive Status: Within Functional Limits for tasks assessed                      General Comments General comments (skin integrity, edema, etc.): Completed a basic occulomotor assessment easily noting Left beating horizontal nystagmus, constant and low amplitude in nature.  Saccades and pursuits were generally functional, but with nystagmus prevalent throughout.  Pt unable to manage VOR, head thrust  positive.  Eventhough answers to questions and nystagmus NOT suggestive of BPPV, tested anyway with negative results for horizontal and posterior BPPV.  Results suggestive of R hypofunction and my guess is that this is infectious, viral in nature.  Exercises        Assessment/Plan    PT Assessment Patient needs continued PT services  PT Diagnosis Difficulty walking;Other (comment) (dysequilibrium)   PT Problem List Decreased balance;Decreased mobility;Decreased knowledge of precautions  PT Treatment Interventions Gait training;Stair training;Functional mobility training;Therapeutic activities;Patient/family education   PT Goals (Current goals can be found in the Care Plan section) Acute Rehab PT Goals Patient Stated Goal: to be able to keep food down PT Goal Formulation: With patient Time For Goal  Achievement: 01/22/16 Potential to Achieve Goals: Good    Frequency     Barriers to discharge        Co-evaluation               End of Session   Activity Tolerance: Patient tolerated treatment well Patient left: in chair;with call bell/phone within reach;with chair alarm set Nurse Communication: Mobility status    Functional Assessment Tool Used: clinical judgement Functional Limitation: Mobility: Walking and moving around Mobility: Walking and Moving Around Current Status (S2831): At least 1 percent but less than 20 percent impaired, limited or restricted Mobility: Walking and Moving Around Goal Status [phone]): At least 1 percent but less than 20 percent impaired, limited or restricted    Time: 1253-1356 PT Time Calculation (min) (ACUTE ONLY): 63 min   Charges:   PT Evaluation $PT Eval Moderate Complexity: 1 Procedure PT Treatments $Gait Training: 8-22 mins $Neuromuscular Re-education: 8-22 mins $Canalith Rep Proc: 8-22 mins   PT G Codes:   PT G-Codes **NOT FOR INPATIENT CLASS** Functional Assessment Tool Used: clinical judgement Functional Limitation: Mobility: Walking and moving around Mobility: Walking and Moving Around Current Status (Y0737): At least 1 percent but less than 20 percent impaired, limited or restricted Mobility: Walking and Moving Around Goal Status [phone]): At least 1 percent but less than 20 percent impaired, limited or restricted    Oriel Ojo, Eliseo Gum 01/18/2016, 2:24 PM  01/18/2016  Dieterich Bing, PT [phone]  (pager)

## 2016-01-19 LAB — HEMOGLOBIN A1C
Hgb A1c MFr Bld: 5.3 % (ref 4.8–5.6)
Mean Plasma Glucose: 105 mg/dL

## 2016-01-19 NOTE — Discharge Summary (Signed)
Family Medicine Teaching [facility] Discharge Summary  Patient name: Alicia Patrick Medical record number: [id number] Date of birth: [birth year] Age: 82 y.o. Gender: female Date of Admission: 01/17/2016  Date of Discharge: 01/18/2016 Admitting Physician: Carney Living, MD  Primary Care Provider: Pcp Not In System Consultants: none  Indication for Hospitalization: dizziness, vomiting, dehyradation  Discharge Diagnoses/Problem List:  BPVV  Disposition: home  Discharge Condition: stable, improved  Discharge Exam:  General: pleasant elderly lady, laying comfortably in bed in no acute distress Eyes: nystagmus ofleft eye with looking to left, EOMI.  ENTM: moist mucous membranes, poor dentition, nares and oropharynx normal Cardiovascular: rrr no murmurs rubs or gallops Respiratory: cta bilaterally no increased work of breathing Abdomen: soft, non-tender, normal bowel sounds MSK: normal strength and tone in upper and lower extremities bilaterally Extremities: warm, well perfused, no cyanosis or edema  Skin: warm, dry, no rashes Neuro: CN2-12 in tact, normal strength and sensation throughout   Brief Hospital Course:  Alicia Patrick a 82 y.o.female [facility] with dizziness and two episodes of vomiting. No significant PMH.   Dizziness, Vomiting Determined to be BPPV worsened by dehydration. Orthostatic vitals were normal. MRI negative for intracranial pathology. EKG, troponins negative. Horizontal nystagmus continued on physical exam reinforcing the diagnosis of vertigo. She was given zofran for nausea and antivert for vertigo. Fluids were given intravenously overnight and discontinued in the morning, patient then tolerated PO fluids without problem. Physical therapy and occupational therapy were consulted for vestibular rehabilitation. Physical therapy noted horizontal nystagmus and positive head thrust and felt the cause to be viral in nature.  They did not recommend follow up. Occupational therapy recommended her to obtain a tub/shower seat for home usage, again no need for follow up. Alicia Patrick was discharged home with zofran and antivert to take as needed. She was advised to follow up with her PCP upon return home to [place] to ensure resolution of symptoms.  Hyperglycemia: Blood sugar elevated to 150 at admission. No prior history of diabetes. Her A1C was checked and came back as 5.3. She was advised to discuss this with her PCP and follow up in the future to ensure she does not develop pre-diabetes.    Issues for Follow Up:   1. Some high blood glucose readings, although A1C was normal 5.3 Follow up as outpatient. 2. No follow up recommended, per physical therapy 3. OT recommended tub/shower seat for home usage  Significant Procedures: none  Significant Labs and Imaging:   Recent Labs Lab 01/17/16 1135 01/18/16 0239  WBC 7.1 9.3  HGB 14.1 13.2  HCT 44.7 41.2  PLT 154 145*    Recent Labs Lab 01/17/16 1135 01/18/16 0239  NA 139 137  K 3.7 3.3*  CL 102 98*  CO2 31 28  GLUCOSE 150* 116*  BUN 18 13  CREATININE 0.88 0.65  CALCIUM 9.1 7.6*  ALKPHOS 33*  --   AST 20  --   ALT 16  --   ALBUMIN 3.8  --    A1C 5.3 EKG nsr  Mr Brain Wo Contrast  Result Date: 01/17/2016 CLINICAL DATA:  Lightheadedness, nausea and vomiting today in hotel lobby. EXAM: MRI HEAD WITHOUT CONTRAST TECHNIQUE: Multiplanar, multiecho pulse sequences of the brain and surrounding structures were obtained without intravenous contrast. COMPARISON:  None. FINDINGS: Multiple sequences are moderately motion degraded. INTRACRANIAL CONTENTS: No reduced diffusion to suggest acute ischemia. No susceptibility artifact to suggest hemorrhage. The ventricles and sulci are  normal for patient's age. Patchy to confluent supratentorial white matter FLAIR T2 hyperintensities. No suspicious parenchymal signal, masses or mass effect. No abnormal extra-axial fluid  collections. No extra-axial masses though, contrast enhanced sequences would be more sensitive. Normal major intracranial vascular flow voids present at skull base. ORBITS: The included ocular globes and orbital contents are non-suspicious. Status post bilateral ocular lens implants. SINUSES: The mastoid air-cells and included paranasal sinuses are well-aerated. SKULL/SOFT TISSUES: No abnormal sellar expansion. No suspicious calvarial bone marrow signal. Craniocervical junction maintained. IMPRESSION: No acute intracranial process on this motion degraded examination. Involutional changes. Moderate to severe chronic small vessel ischemic disease. Electronically Signed   By: Awilda Metroourtnay  Bloomer M.D.   On: 01/17/2016 20:52   Results/Tests Pending at Time of Discharge: none  Discharge Medications:    Medication List    TAKE these medications   meclizine 12.5 MG tablet Commonly known as:  ANTIVERT Take 1 tablet (12.5 mg total) by mouth 2 (two) times daily as needed for dizziness.   multivitamin tablet Take 1 tablet by mouth daily.   ondansetron 4 MG disintegrating tablet Commonly known as:  ZOFRAN-ODT Take 1 tablet (4 mg total) by mouth every 8 (eight) hours as needed for nausea or vomiting.       Discharge Instructions: Please refer to Patient Instructions section of EMR for full details.  Patient was counseled important signs and symptoms that should prompt return to medical care, changes in medications, dietary instructions, activity restrictions, and follow up appointments.   Follow-Up Appointments: Follow-up Information    [facility].   Specialty:  Emergency Medicine Why:  As needed should symptoms recur Contact information: [address] [id number] [address] [address]  [id number] [phone]          Tillman Sersngela C Deshun Sedivy, DO 01/19/2016, 9:41 AM PGY-1, [facility]

## 2017-09-21 IMAGING — MR MR HEAD W/O CM
9 of 10 series · 38 of 48 positions shown · non-contrast
Comparison: None.

CLINICAL DATA: Lightheadedness, nausea and vomiting today in [place].

EXAM:
MRI HEAD WITHOUT CONTRAST
TECHNIQUE: Multiplanar, multiecho pulse sequences of the brain and surrounding
structures were obtained without intravenous contrast.

[Series 3: DWI · axial · 3.0mm · 1.09mm/px · z∈[-62,+82]mm · 11 of 98 slices shown (1 of 4)]
[im 1/98]
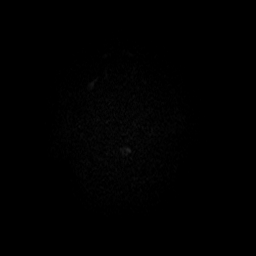
[im 10/98]
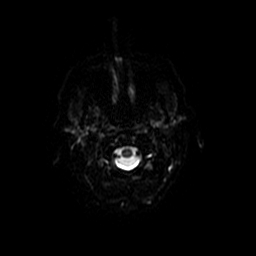
[im 20/98]
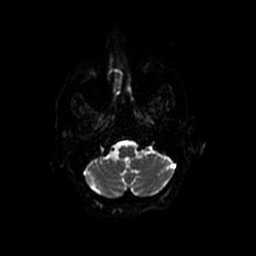
[im 30/98]
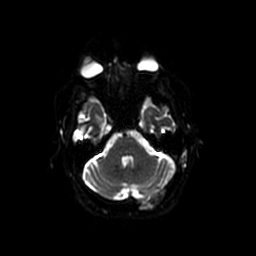
[im 39/98]
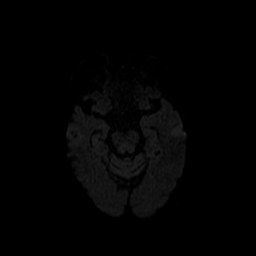
[im 49/98]
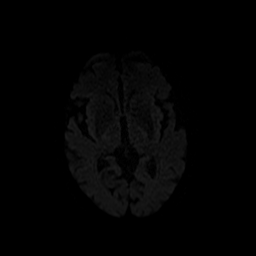
[im 59/98]
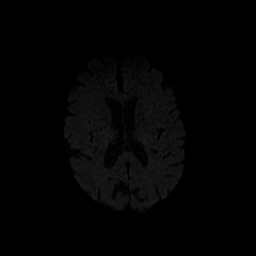
[im 68/98]
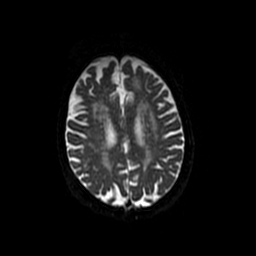
[im 78/98]
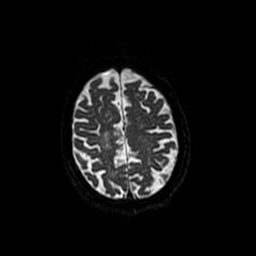
[im 88/98]
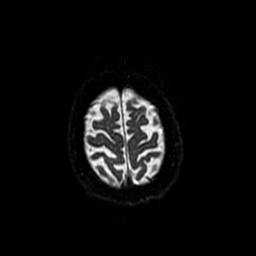
[im 98/98]
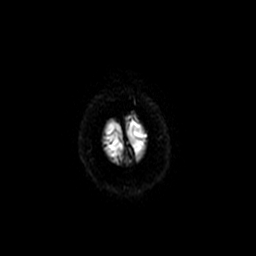

[Series 4: T2 · axial · 5.0mm · 0.47mm/px · z∈[-58,+85]mm · 2 of 25 slices shown (1 of 2)]
[im 1/25]
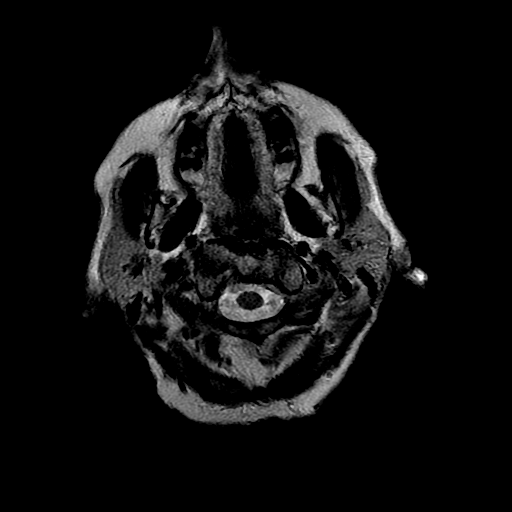
[im 25/25]
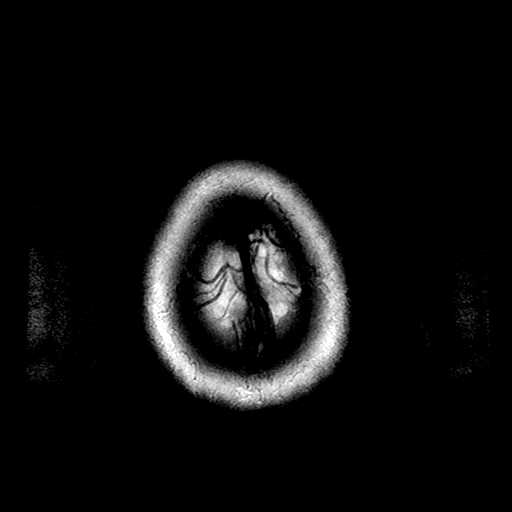

[Series 6: FLAIR · axial · 5.0mm · 0.47mm/px · z∈[-58,+85]mm · 2 of 25 slices shown (1 of 2)]
[im 1/25]
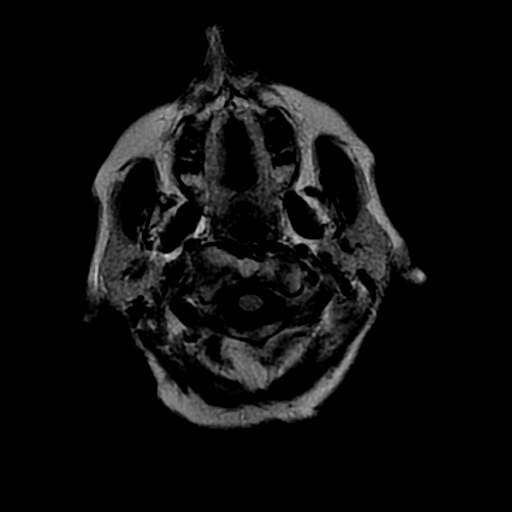
[im 25/25]
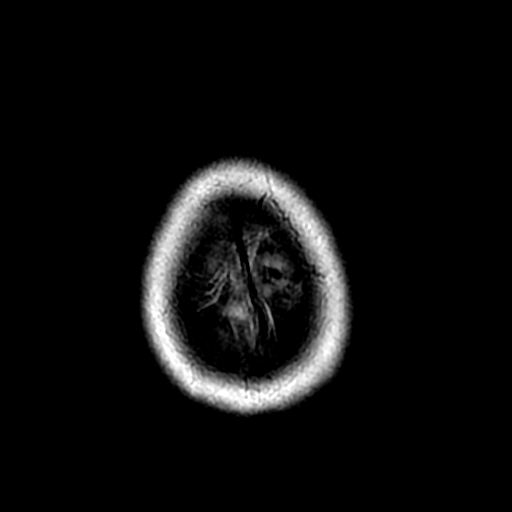

[Series 7: DWI · coronal · 5.0mm · 1.09mm/px · 7 of 72 slices shown (2 of 4)]
[im 1/72]
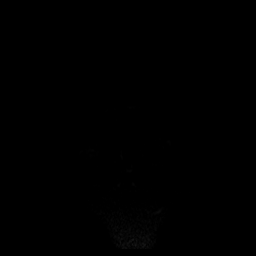
[im 12/72]
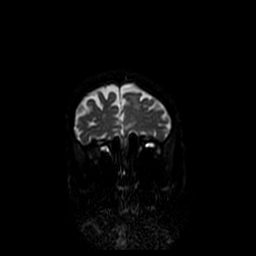
[im 24/72]
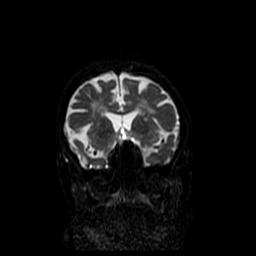
[im 36/72]
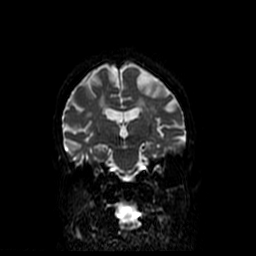
[im 48/72]
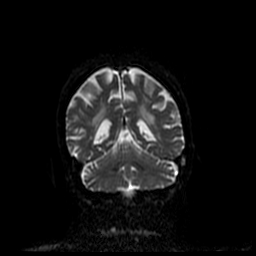
[im 60/72]
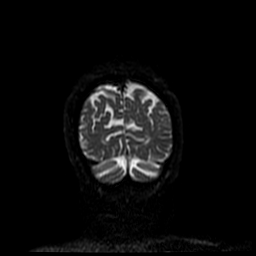
[im 72/72]
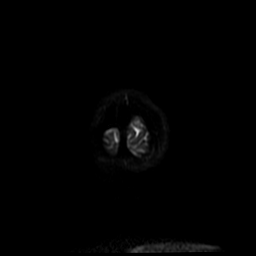

[Series 8: ax mpgr · axial · 5.0mm · 0.47mm/px · z∈[-58,+85]mm · 2 of 25 slices shown]
[im 1/25]
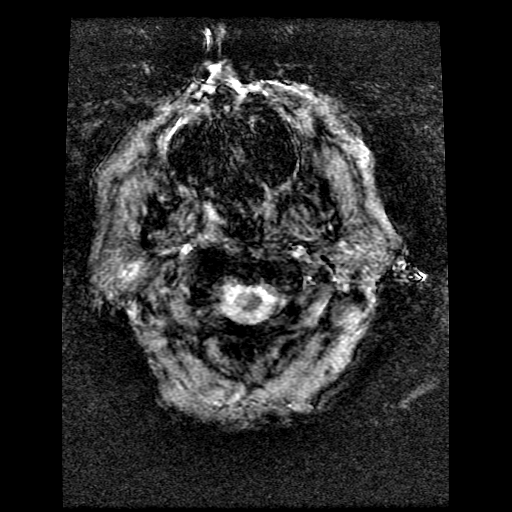
[im 25/25]
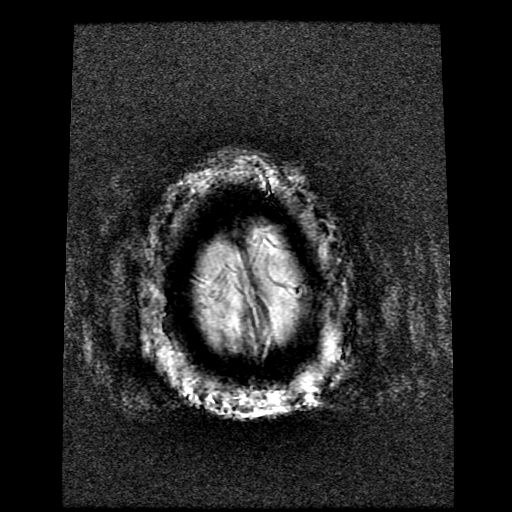

[Series 9: FLAIR · sagittal · 5.0mm · 0.47mm/px · 2 of 25 slices shown (2 of 2)]
[im 1/25]
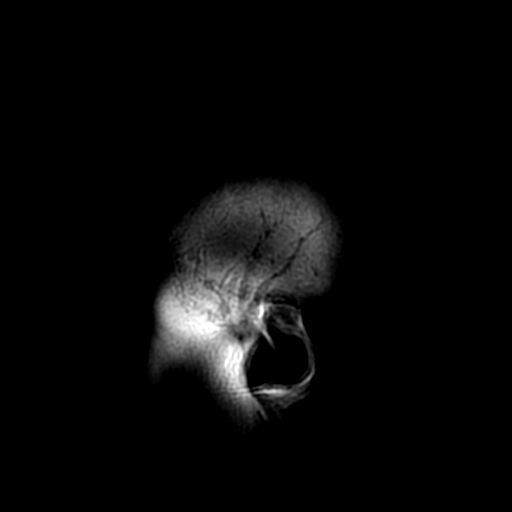
[im 25/25]
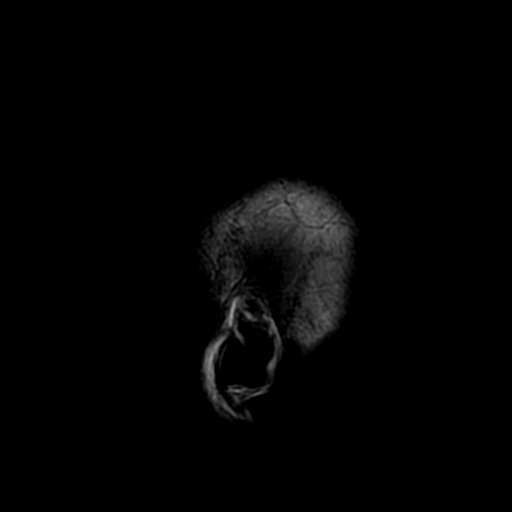

[Series 10: T2 · coronal · 5.0mm · 0.39mm/px · 3 of 28 slices shown (2 of 2)]
[im 1/28]
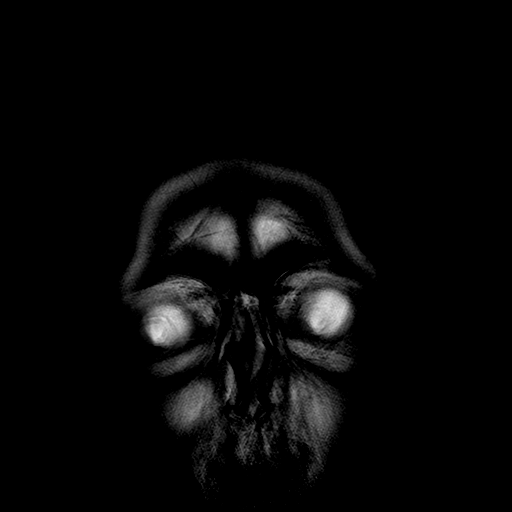
[im 14/28]
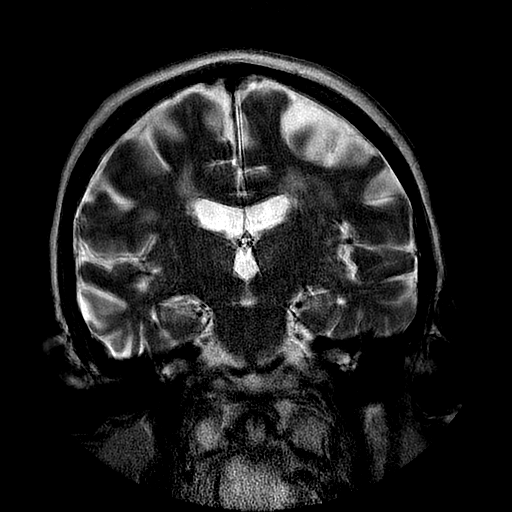
[im 28/28]
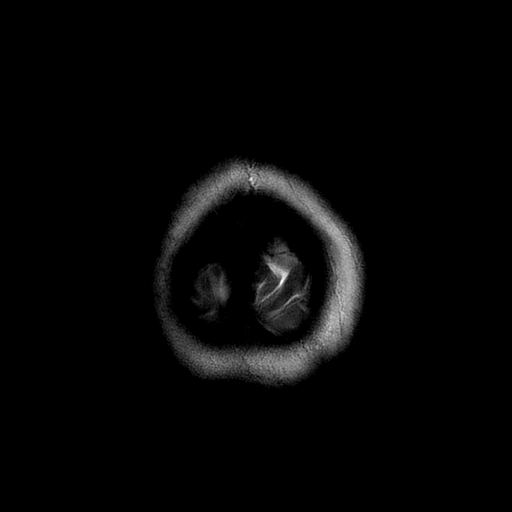

[Series 300: DWI · axial · 3.0mm · 1.09mm/px · z∈[-62,+82]mm · 5 of 49 slices shown (3 of 4)]
[im 1/49]
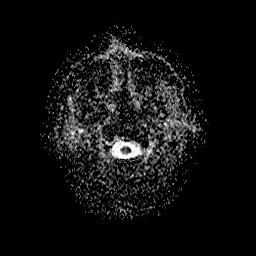
[im 13/49]
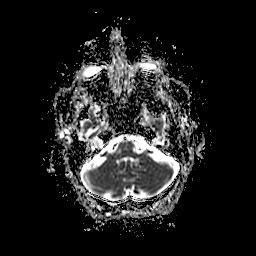
[im 25/49]
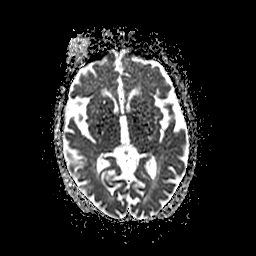
[im 37/49]
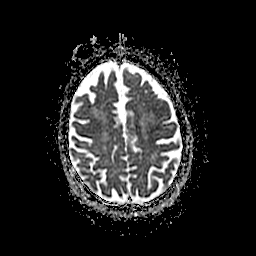
[im 49/49]
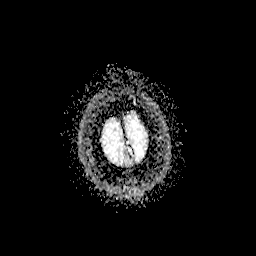

[Series 700: DWI · coronal · 5.0mm · 1.09mm/px · 4 of 36 slices shown (4 of 4)]
[im 1/36]
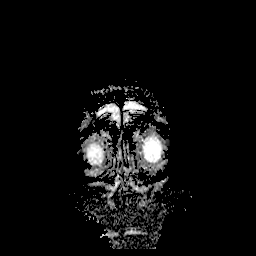
[im 12/36]
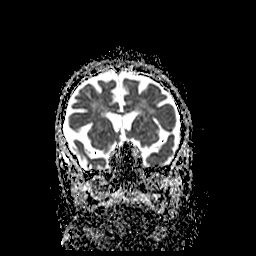
[im 24/36]
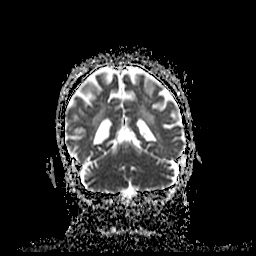
[im 36/36]
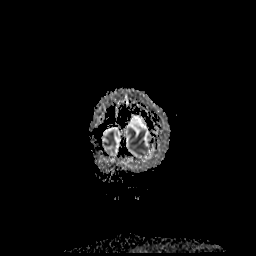

[38 of 48 positions shown; findings below may reference images not displayed]

FINDINGS: Multiple sequences are moderately motion degraded.

INTRACRANIAL CONTENTS: No reduced diffusion to suggest acute
ischemia. No susceptibility artifact to suggest hemorrhage. The
ventricles and sulci are normal for patient's age. Patchy to
confluent supratentorial white matter FLAIR T2 hyperintensities. No
suspicious parenchymal signal, masses or mass effect. No abnormal
extra-axial fluid collections. No extra-axial masses though,
contrast enhanced sequences would be more sensitive. Normal major
intracranial vascular flow voids present at skull base.

ORBITS: The included ocular globes and orbital contents are
non-suspicious. Status post bilateral ocular lens implants.

SINUSES: The mastoid air-cells and included paranasal sinuses are
well-aerated.

SKULL/SOFT TISSUES: No abnormal sellar expansion. No suspicious
calvarial bone marrow signal. Craniocervical junction maintained.
IMPRESSION: No acute intracranial process on this motion degraded examination.

Involutional changes. Moderate to severe chronic small vessel
ischemic disease.

## 2021-12-12 DEATH — deceased
